# Patient Record
Sex: Male | Born: 1990 | Race: Black or African American | Hispanic: No | Marital: Single | Smoking: Current some day smoker
Health system: Southern US, Community
[De-identification: ages and names within clinical notes are randomized; demographics above are authoritative.]

---
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
## 2011-01-08 ENCOUNTER — Ambulatory Visit (INDEPENDENT_AMBULATORY_CARE_PROVIDER_SITE_OTHER): Payer: 59

## 2011-01-08 ENCOUNTER — Inpatient Hospital Stay (INDEPENDENT_AMBULATORY_CARE_PROVIDER_SITE_OTHER)
Admission: RE | Admit: 2011-01-08 | Discharge: 2011-01-08 | Disposition: A | Discharge status: Home / Self Care | Payer: 59 | Source: Ambulatory Visit | Attending: Family Medicine | Admitting: Family Medicine

## 2011-01-08 DIAGNOSIS — S63509A Unspecified sprain of unspecified wrist, initial encounter: Secondary | ICD-10-CM

## 2011-01-08 DIAGNOSIS — S93609A Unspecified sprain of unspecified foot, initial encounter: Secondary | ICD-10-CM

## 2011-01-24 ENCOUNTER — Inpatient Hospital Stay (INDEPENDENT_AMBULATORY_CARE_PROVIDER_SITE_OTHER)
Admission: RE | Admit: 2011-01-24 | Discharge: 2011-01-24 | Disposition: A | Discharge status: Home / Self Care | Payer: Managed Care, Other (non HMO) | Source: Ambulatory Visit | Attending: Family Medicine | Admitting: Family Medicine

## 2011-01-24 DIAGNOSIS — R6889 Other general symptoms and signs: Secondary | ICD-10-CM

## 2014-09-30 ENCOUNTER — Emergency Department (HOSPITAL_COMMUNITY)
Admission: EM | Admit: 2014-09-30 | Discharge: 2014-09-30 | Disposition: A | Discharge status: Home / Self Care | Payer: Managed Care, Other (non HMO) | Attending: Emergency Medicine | Admitting: Emergency Medicine

## 2014-09-30 ENCOUNTER — Encounter (HOSPITAL_COMMUNITY): Payer: Self-pay | Admitting: Emergency Medicine

## 2014-09-30 DIAGNOSIS — Z720 Tobacco use: Secondary | ICD-10-CM | POA: Insufficient documentation

## 2014-09-30 DIAGNOSIS — K0889 Other specified disorders of teeth and supporting structures: Secondary | ICD-10-CM

## 2014-09-30 DIAGNOSIS — K088 Other specified disorders of teeth and supporting structures: Secondary | ICD-10-CM | POA: Insufficient documentation

## 2014-09-30 MED ORDER — PENICILLIN V POTASSIUM 250 MG PO TABS
500.0000 mg | ORAL_TABLET | Freq: Once | ORAL | Status: AC
  Administered 2014-09-30: 500 mg via ORAL
  Filled 2014-09-30: qty 2

## 2014-09-30 MED ORDER — PENICILLIN V POTASSIUM 500 MG PO TABS: 500.0000 mg | ORAL_TABLET | Freq: Four times a day (QID) | ORAL | Status: AC

## 2014-09-30 MED ORDER — HYDROCODONE-ACETAMINOPHEN 5-325 MG PO TABS: 1.0000 | ORAL_TABLET | ORAL | Status: DC | PRN

## 2014-09-30 MED ORDER — HYDROCODONE-ACETAMINOPHEN 5-325 MG PO TABS
2.0000 | ORAL_TABLET | Freq: Once | ORAL | Status: AC
  Administered 2014-09-30: 2 via ORAL
  Filled 2014-09-30: qty 2

## 2014-09-30 NOTE — ED Notes (Signed)
C/O left lower dental pain and swelling x 2 days.

## 2014-09-30 NOTE — ED Provider Notes (Signed)
CSN: [id number]     Arrival date & time 09/30/14  1314 History  This chart was scribed for non-physician practitioner, Emilia BeckKaitlyn Dema Timmons, PA-C, working with Raeford RazorStephen Kohut, MD, by Ronney LionSuzanne Le, ED Scribe. This patient was seen in room TR07C/TR07C and the patient's care was started at 2:23 PM.     Chief Complaint  Patient presents with  . Dental Pain   HPI   HPI Comments: Herbert Cohen is a 24 y.o. male who presents to the Emergency Department complaining of dental pain that began one month ago and gingival swelling that began yesterday. Patient has tried one pill of his friend's prescription pain medication with relief. He denies fever. The pain is aching and severe without radiation. No aggravating/alleviating factors.    History reviewed. No pertinent past medical history. History reviewed. No pertinent past surgical history. No family history on file. History  Substance Use Topics  . Smoking status: Current Some Day Smoker  . Smokeless tobacco: Not on file  . Alcohol Use: Yes    Review of Systems  HENT: Positive for dental problem.   All other systems reviewed and are negative.     Allergies  Review of patient's allergies indicates no known allergies.  Home Medications   Prior to Admission medications   Not on File   BP 127/67 mmHg  Pulse 67  Temp(Src) 98.1 F (36.7 C) (Oral)  Ht 6\' 3"  (1.905 m)  Wt 180 lb (81.647 kg)  BMI 22.50 kg/m2  SpO2 100% Physical Exam  Constitutional: He is oriented to person, place, and time. He appears well-developed and well-nourished. No distress.  HENT:  Head: Normocephalic and atraumatic.  Good dentition. Gingival swelling, erythema, and tenderness to palpation in the left lower posterior area. No abscess noted.  Eyes: Conjunctivae and EOM are normal.  Neck: Neck supple. No tracheal deviation present.  Cardiovascular: Normal rate.   Pulmonary/Chest: Effort normal. No respiratory distress.  Musculoskeletal: Normal range of motion.   Neurological: He is alert and oriented to person, place, and time.  Skin: Skin is warm and dry.  Psychiatric: He has a normal mood and affect. His behavior is normal.  Nursing note and vitals reviewed.   ED Course  Procedures (including critical care time)  DIAGNOSTIC STUDIES: Oxygen Saturation is 100% on room air, normal by my interpretation.    COORDINATION OF CARE: 2:27 PM - Discussed treatment plan with pt at bedside which includes antibiotics, pain medication, and referral to on-call dentist and pt agreed to plan. Instructions to return if patient notices a fever.    MDM   Final diagnoses:  Pain, dental   Patient has no signs of ludwigs angina or drainable dental abscess. Vitals stable and patient afebrile. Patient will have Veetid and Vicodin for symptoms. Patient instructed to return with worsening or concerning symptoms.   I personally performed the services described in this documentation, which was scribed in my presence. The recorded information has been reviewed and is accurate.     [address]Jaziyah Gradel BanksSzekalski, PA-C 10/03/14 [phone]  Raeford RazorStephen Kohut, MD 10/03/14 [phone]

## 2014-09-30 NOTE — Discharge Instructions (Signed)
Take Veetid as directed until gone. Take Vicodin as needed for pain. Follow up with Dr. Russella DarBenitez for further evaluation. Return to the ED with worsening or concerning symptoms.

## 2016-08-23 ENCOUNTER — Encounter (HOSPITAL_COMMUNITY): Payer: Self-pay | Admitting: Emergency Medicine

## 2016-08-23 ENCOUNTER — Emergency Department (HOSPITAL_COMMUNITY)
Admission: EM | Admit: 2016-08-23 | Discharge: 2016-08-23 | Disposition: A | Discharge status: Home / Self Care | Payer: Managed Care, Other (non HMO) | Attending: Emergency Medicine | Admitting: Emergency Medicine

## 2016-08-23 DIAGNOSIS — H00015 Hordeolum externum left lower eyelid: Secondary | ICD-10-CM | POA: Insufficient documentation

## 2016-08-23 DIAGNOSIS — F17200 Nicotine dependence, unspecified, uncomplicated: Secondary | ICD-10-CM | POA: Insufficient documentation

## 2016-08-23 DIAGNOSIS — H00012 Hordeolum externum right lower eyelid: Secondary | ICD-10-CM

## 2016-08-23 MED ORDER — POLYMYXIN B-TRIMETHOPRIM 10000-0.1 UNIT/ML-% OP SOLN
1.0000 [drp] | Freq: Once | OPHTHALMIC | Status: AC
  Administered 2016-08-23: 1 [drp] via OPHTHALMIC
  Filled 2016-08-23: qty 10

## 2016-08-23 MED ORDER — POLYMYXIN B-TRIMETHOPRIM 10000-0.1 UNIT/ML-% OP SOLN: 1.0000 [drp] | OPHTHALMIC | 0 refills | Status: AC

## 2016-08-23 NOTE — Discharge Instructions (Signed)
Please follow with your primary care doctor in the next 2 days for a check-up. They must obtain records for further management.  ° °Do not hesitate to return to the Emergency Department for any new, worsening or concerning symptoms.  ° °

## 2016-08-23 NOTE — ED Provider Notes (Signed)
[facility]-EMERGENCY DEPT Provider Note   CSN: [id number] Arrival date & time: 08/23/16  2104  By signing my name below, I, Rosario AdieWilliam Andrew Hiatt, attest that this documentation has been prepared under the direction and in the presence of [employer] Corporation[employer]ole Onnie Hatchel, PA-C.  Electronically Signed: Rosario AdieWilliam Andrew Hiatt, ED Scribe. 08/23/16. 10:07 PM.  History   Chief Complaint Chief Complaint  Patient presents with  . Stye   The history is provided by the patient. No language interpreter was used.    HPI Comments: Herbert Cohen is a 25 y.o. male with no other PMHx, who presents to the Emergency Department complaining of gradually worsening, small area of pain and swelling to the left, lower eyelid onset approximately 3 weeks ago. Pt reports associated left eye redness and occasional clear drainage from the eye since the onset of this issue. He has been applying warm compresses to the area with minimal relief. His pain to the area is exacerbated with blinking. He denies fever, blurry vision, or any other associated symptoms.  History reviewed. No pertinent past medical history.  There are no active problems to display for this patient.  History reviewed. No pertinent surgical history.  Home Medications    Prior to Admission medications   Medication Sig Start Date End Date Taking? Authorizing Provider  HYDROcodone-acetaminophen (NORCO/VICODIN) 5-325 MG per tablet Take 1-2 tablets by mouth every 4 (four) hours as needed for moderate pain or severe pain. 09/30/14   Kaitlyn Szekalski, PA-C  penicillin v potassium (VEETID) 500 MG tablet Take 1 tablet (500 mg total) by mouth 4 (four) times daily. 09/30/14   Emilia BeckKaitlyn Szekalski, PA-C   Family History No family history on file.  Social History Social History  Substance Use Topics  . Smoking status: Current Some Day Smoker  . Smokeless tobacco: Never Used  . Alcohol use Yes   Allergies   Patient has no known allergies.  Review of Systems Review of  Systems  A complete 10 system review of systems was obtained and all systems are negative except as noted in the HPI and PMH.   Physical Exam Updated Vital Signs BP 154/82 (BP Location: Left Arm)   Pulse 74   Temp 98.3 F (36.8 C) (Oral)   Resp 18   Ht 6\' 3"  (1.905 m)   Wt 185 lb (83.9 kg)   SpO2 100%   BMI 23.12 kg/m   Physical Exam  Constitutional: He appears well-developed and well-nourished. No distress.  HENT:  Head: Normocephalic and atraumatic.  Left lower eyelid with stye to the medial aspects. Trace conjunctival injection and no discharge pupils equal round reactive to light, extraocular movement is intact without pain or diplopia.  Eyes: Conjunctivae are normal.  Neck: Normal range of motion.  Cardiovascular: Normal rate.   Pulmonary/Chest: Effort normal.  Abdominal: He exhibits no distension.  Musculoskeletal: Normal range of motion.  Neurological: He is alert.  Skin: No pallor.  Psychiatric: He has a normal mood and affect. His behavior is normal.  Nursing note and vitals reviewed.  ED Treatments / Results  DIAGNOSTIC STUDIES: Oxygen Saturation is 100% on RA, normal by my interpretation.   COORDINATION OF CARE: 10:07 PM-Discussed next steps with pt. Pt verbalized understanding and is agreeable with the plan.   Labs (all labs ordered are listed, but only abnormal results are displayed) Labs Reviewed - No data to display  EKG  EKG Interpretation None      Radiology No results found.  Procedures Procedures  Medications Ordered in ED  Medications - No data to display  Initial Impression / Assessment and Plan / ED Course  I have reviewed the triage vital signs and the nursing notes.  Pertinent labs & imaging results that were available during my care of the patient were reviewed by me and considered in my medical decision making (see chart for details).  Clinical Course    Vitals:   08/23/16 2109  BP: 154/82  Pulse: 74  Resp: 18  Temp:  98.3 F (36.8 C)  TempSrc: Oral  SpO2: 100%  Weight: 83.9 kg  Height: 6\' 3"  (1.905 m)    Medications  trimethoprim-polymyxin b (POLYTRIM) ophthalmic solution 1 drop (not administered)    Herbert Cohen is 25 y.o. male presenting with Persistent stye to left eye, patient will be started on Polytrim, he's artery performing warm compresses. Will be given referral to ophthalmology for possible procedure.   Evaluation does not show pathology that would require ongoing emergent intervention or inpatient treatment. Pt is hemodynamically stable and mentating appropriately. Discussed findings and plan with patient/guardian, who agrees with care plan. All questions answered. Return precautions discussed and outpatient follow up given.      Final Clinical Impressions(s) / ED Diagnoses   Final diagnoses:  None   New Prescriptions New Prescriptions   No medications on file   I personally performed the services described in this documentation, which was scribed in my presence. The recorded information has been reviewed and is accurate.     Wynetta Emeryicole Avereigh Spainhower, PA-C 08/23/16 2224    Rolland PorterMark James, MD 09/06/16 0030

## 2016-08-23 NOTE — ED Notes (Signed)
Patient Alert and oriented X4. Stable and ambulatory. Patient verbalized understanding of the discharge instructions.  Patient belongings were taken by the patient.  

## 2016-08-23 NOTE — ED Triage Notes (Signed)
Patient reports stye at left eye onset 3 weeks ago with redness/occasional drainage , denies blurred vision .

## 2021-02-16 ENCOUNTER — Emergency Department (HOSPITAL_COMMUNITY)
Admission: EM | Admit: 2021-02-16 | Discharge: 2021-02-17 | Disposition: A | Discharge status: Home / Self Care | Payer: Self-pay | Attending: Emergency Medicine | Admitting: Emergency Medicine

## 2021-02-16 ENCOUNTER — Emergency Department (HOSPITAL_COMMUNITY): Payer: Self-pay

## 2021-02-16 ENCOUNTER — Other Ambulatory Visit: Payer: Self-pay

## 2021-02-16 ENCOUNTER — Encounter (HOSPITAL_COMMUNITY): Payer: Self-pay

## 2021-02-16 DIAGNOSIS — N50812 Left testicular pain: Secondary | ICD-10-CM | POA: Insufficient documentation

## 2021-02-16 DIAGNOSIS — F17200 Nicotine dependence, unspecified, uncomplicated: Secondary | ICD-10-CM | POA: Insufficient documentation

## 2021-02-16 DIAGNOSIS — N451 Epididymitis: Secondary | ICD-10-CM | POA: Insufficient documentation

## 2021-02-16 LAB — URINALYSIS, COMPLETE (UACMP) WITH MICROSCOPIC
Bilirubin Urine: NEGATIVE
Glucose, UA: NEGATIVE mg/dL
Ketones, ur: NEGATIVE mg/dL
Nitrite: NEGATIVE
Protein, ur: NEGATIVE mg/dL
RBC / HPF: >50 RBC/hpf — ABNORMAL HIGH (ref 0–5)
Specific Gravity, Urine: 1.026 (ref 1.005–1.030)
pH: 6 (ref 5.0–8.0)

## 2021-02-16 NOTE — ED Notes (Signed)
Ultrasound at bedside

## 2021-02-16 NOTE — ED Triage Notes (Signed)
Pt c/o testicular pain x3 days. Pt c/o discharge coming from penis x3 days. Pt had unprotected sex 1 month ago.

## 2021-02-16 NOTE — ED Provider Notes (Signed)
Emergency Medicine Provider Triage Evaluation Note  Herbert Cohen , a 30 y.o. male  was evaluated in triage.  Pt complains of discharge for about a week.  He states that three days ago he started to have pain and swelling in the left testicle.    No fevers, pain and swelling in left testicle only.  Pain is constant for three days.    No rectal pain, no pain with BM.  He does report that he had a small ulcer that was non painful on his penis when this all started.   Review of Systems  Positive: *Left testicle pain and swelling, penile discharge Negative: Fever  Physical Exam  BP (!) 137/94 (BP Location: Left Arm)   Pulse 83   Temp 98.4 F (36.9 C) (Oral)   Resp 18   Ht 6\' 3"  (1.905 m)   Wt 77.1 kg   SpO2 100%   BMI 21.25 kg/m  Gen:   Awake, no distress   Resp:  Normal effort  MSK:   Moves extremities without difficulty  Other:  GU exam deferred.   Medical Decision Making  Medically screening exam initiated at 10:52 PM.  Appropriate orders placed.  Herbert Cohen was informed that the remainder of the evaluation will be completed by another provider, this initial triage assessment does not replace that evaluation, and the importance of remaining in the ED until their evaluation is complete.  Orders placed for UA, GC, HIV and RPR given he reports a painless ulcer.      Darral Dash 02/16/21 2257    02/18/21, MD 02/16/21 2314

## 2021-02-16 NOTE — ED Notes (Signed)
Patient in room, complains of pain 7/10 in left teste.

## 2021-02-16 NOTE — ED Provider Notes (Signed)
[facility]-EMERGENCY DEPT Provider Note   CSN: [id number] Arrival date & time: 02/16/21  2239     History Chief Complaint  Patient presents with  . Groin Pain    Herbert Cohen is a 30 y.o. male.  The history is provided by the patient and medical records.  Groin Pain    29 y.o. M presenting to the ED with groin pain.  States 3 days ago he started noticing some pain/tenderness in his left testicle.  This has become increasingly more pain and now feels like testicle is swollen.  He does report some penile discharge.  He had sexual encounter with ex girlfriend recently, unsure of her STD status or if she is having symptoms.  He did notice a painless "bump" on his penis about a week ago, that has since gone away.  He denies history of herpes or similar.  No fevers, rashes.  History reviewed. No pertinent past medical history.  There are no problems to display for this patient.   History reviewed. No pertinent surgical history.     History reviewed. No pertinent family history.  Social History   Tobacco Use  . Smoking status: Current Some Day Smoker  . Smokeless tobacco: Never Used  Substance Use Topics  . Alcohol use: Yes  . Drug use: No    Home Medications Prior to Admission medications   Medication Sig Start Date End Date Taking? Authorizing Provider  HYDROcodone-acetaminophen (NORCO/VICODIN) 5-325 MG per tablet Take 1-2 tablets by mouth every 4 (four) hours as needed for moderate pain or severe pain. 09/30/14   Emilia Beck, PA-C  penicillin v potassium (VEETID) 500 MG tablet Take 1 tablet (500 mg total) by mouth 4 (four) times daily. 09/30/14   Emilia Beck, PA-C  trimethoprim-polymyxin b (POLYTRIM) ophthalmic solution Place 1 drop into the right eye every 4 (four) hours. 08/23/16   Pisciotta, Joni Reining, PA-C    Allergies    Patient has no known allergies.  Review of Systems   Review of Systems  Genitourinary: Positive for testicular  pain.  All other systems reviewed and are negative.   Physical Exam Updated Vital Signs BP (!) 137/94 (BP Location: Left Arm)   Pulse 83   Temp 98.4 F (36.9 C) (Oral)   Resp 18   Ht 6\' 3"  (1.905 m)   Wt 77.1 kg   SpO2 100%   BMI 21.25 kg/m   Physical Exam Vitals and nursing note reviewed.  Constitutional:      Appearance: He is well-developed.  HENT:     Head: Normocephalic and atraumatic.  Eyes:     Conjunctiva/sclera: Conjunctivae normal.     Pupils: Pupils are equal, round, and reactive to light.  Cardiovascular:     Rate and Rhythm: Normal rate and regular rhythm.     Heart sounds: Normal heart sounds.  Pulmonary:     Effort: Pulmonary effort is normal.     Breath sounds: Normal breath sounds.  Abdominal:     General: Bowel sounds are normal.     Palpations: Abdomen is soft.  Genitourinary:    Comments: Exam chaperoned by RN Normal male genitalia, penis circumcised without visible rash/lesion, no noted discharge, left testicle is swollen and firm compared with right, normal lie Musculoskeletal:        General: Normal range of motion.     Cervical back: Normal range of motion.  Skin:    General: Skin is warm and dry.  Neurological:     Mental  Status: He is alert and oriented to person, place, and time.     ED Results / Procedures / Treatments   Labs (all labs ordered are listed, but only abnormal results are displayed) Labs Reviewed  URINALYSIS, COMPLETE (UACMP) WITH MICROSCOPIC - Abnormal; Notable for the following components:      Result Value   APPearance CLOUDY (*)    Hgb urine dipstick SMALL (*)    Leukocytes,Ua LARGE (*)    RBC / HPF >50 (*)    Bacteria, UA RARE (*)    All other components within normal limits  RPR  HIV ANTIBODY (ROUTINE TESTING W REFLEX)  GC/CHLAMYDIA PROBE AMP ([place]) NOT AT [facility]    EKG None  Radiology US SCROTUM W/DOPPLER  Result Date: 02/17/2021 CLINICAL DATA:  Left testicular pain X 3 days EXAM: SCROTAL  ULTRASOUND DOPPLER ULTRASOUND OF THE TESTICLES TECHNIQUE: Complete ultrasound examination of the testicles, epididymis, and other scrotal structures was performed. Color and spectral Doppler ultrasound were also utilized to evaluate blood flow to the testicles. COMPARISON:  None. FINDINGS: Right testicle Measurements: 4.4 x 2.4 x 2.9 cm. No mass or microlithiasis visualized. Left testicle Measurements: 3.7 x 2 x 2.8 cm. No mass or microlithiasis visualized. Right epididymis: Epididymal cyst measuring up to 0.6 cm. Otherwise normal in size and appearance. Left epididymis: Enlarged and heterogeneous with increased vascularity. Hydrocele:  None visualized. Varicocele:  None visualized. Pulsed Doppler interrogation of both testes demonstrates normal low resistance arterial and venous waveforms bilaterally. IMPRESSION: Left epididymitis.  No abscess formation. These results will be called to the ordering clinician or representative by the Radiologist Assistant, and communication documented in the PACS or [employer]. Electronically Signed   By: Tish Frederickson M.D.   On: 02/17/2021 00:22    Procedures Procedures   Medications Ordered in ED Medications - No data to display  ED Course  I have reviewed the triage vital signs and the nursing notes.  Pertinent labs & imaging results that were available during my care of the patient were reviewed by me and considered in my medical decision making (see chart for details).    MDM Rules/Calculators/A&P  30 year old male presenting to the ED with left testicle pain.  Also reports some penile discharge and pain "bump" to his penis a few days ago but that is since resolved.  Does report sexual encounter with his ex-girlfriend recently and he is unsure of her STD status.  On exam, left testicle does appear swollen and firm compared with right but there is a normal lie.  There is no appreciable penile lesion or discharge.  HIV, RPR, gc/chl testing has been sent.   Ultrasound of the scrotum with findings of left epididymitis.  There is no associated abscess formation and he has good blood flow to both testes.  Suspect this may ultimately be STD related.  He is treated empirically here for gonorrhea and chlamydia and will discharge home on course of doxycycline.  Given urology follow-up.  May return here for new concerns.  Final Clinical Impression(s) / ED Diagnoses Final diagnoses:  Pain in left testicle  Epididymitis    Rx / DC Orders ED Discharge Orders         Ordered    doxycycline (VIBRAMYCIN) 100 MG capsule  2 times daily        02/17/21 0127           Garlon Hatchet, PA-C 02/17/21 0139    Dione Booze, MD 02/17/21 [phone]

## 2021-02-17 LAB — RPR: RPR Ser Ql: NONREACTIVE

## 2021-02-17 LAB — HIV ANTIBODY (ROUTINE TESTING W REFLEX): HIV Screen 4th Generation wRfx: NONREACTIVE

## 2021-02-17 MED ORDER — STERILE WATER FOR INJECTION IJ SOLN: 2.0000 mL | Freq: Once | INTRAMUSCULAR | Status: AC

## 2021-02-17 MED ORDER — DOXYCYCLINE HYCLATE 100 MG PO TABS
100.0000 mg | ORAL_TABLET | Freq: Once | ORAL | Status: AC
  Administered 2021-02-17: 100 mg via ORAL
  Filled 2021-02-17: qty 1

## 2021-02-17 MED ORDER — CEFTRIAXONE SODIUM 250 MG IJ SOLR
250.0000 mg | Freq: Once | INTRAMUSCULAR | Status: AC
  Administered 2021-02-17: 250 mg via INTRAMUSCULAR
  Filled 2021-02-17: qty 250

## 2021-02-17 MED ORDER — DOXYCYCLINE HYCLATE 100 MG PO CAPS: 100.0000 mg | ORAL_CAPSULE | Freq: Two times a day (BID) | ORAL | 0 refills | Status: DC

## 2021-02-17 MED ORDER — AZITHROMYCIN 250 MG PO TABS
1000.0000 mg | ORAL_TABLET | Freq: Once | ORAL | Status: AC
  Administered 2021-02-17: 1000 mg via ORAL
  Filled 2021-02-17: qty 4

## 2021-02-17 MED ORDER — STERILE WATER FOR INJECTION IJ SOLN
INTRAMUSCULAR | Status: AC
  Administered 2021-02-17: 2 mL via INTRAMUSCULAR
  Filled 2021-02-17: qty 10

## 2021-02-17 NOTE — Discharge Instructions (Addendum)
Take the prescribed medication as directed.  Make sure to finish ALL the medication. You will be contacted if your STD culture are positive. Follow-up with urology if any ongoing issues or if symptoms not improving. Return to the ED for new or worsening symptoms-- high fever, difficulty urinating, etc.

## 2021-10-22 ENCOUNTER — Other Ambulatory Visit: Payer: Self-pay

## 2021-10-22 ENCOUNTER — Emergency Department (HOSPITAL_COMMUNITY)
Admission: EM | Admit: 2021-10-22 | Discharge: 2021-10-22 | Disposition: A | Discharge status: Home / Self Care | Payer: Medicaid Other | Attending: Emergency Medicine | Admitting: Emergency Medicine

## 2021-10-22 ENCOUNTER — Encounter (HOSPITAL_COMMUNITY): Payer: Self-pay | Admitting: Emergency Medicine

## 2021-10-22 DIAGNOSIS — H00014 Hordeolum externum left upper eyelid: Secondary | ICD-10-CM | POA: Insufficient documentation

## 2021-10-22 MED ORDER — CEPHALEXIN 500 MG PO CAPS
500.0000 mg | ORAL_CAPSULE | Freq: Once | ORAL | Status: AC
Start: 2021-10-22 — End: 2021-10-22
  Administered 2021-10-22: 500 mg via ORAL
  Filled 2021-10-22: qty 1

## 2021-10-22 MED ORDER — CEPHALEXIN 500 MG PO CAPS: 500.0000 mg | ORAL_CAPSULE | Freq: Two times a day (BID) | ORAL | 0 refills | Status: AC

## 2021-10-22 NOTE — ED Provider Triage Note (Signed)
Emergency Medicine Provider Triage Evaluation Note  Herbert Cohen , a 31 y.o. male  was evaluated in triage.  Pt complains of left eye pain and blurriness. States that he has had a stye for a few weeks now however over the past week has had worsening blurry vision in the affected left eye.  He denies any fevers and otherwise feels well.   Physical Exam  BP 132/81 (BP Location: Right Arm)    Pulse (!) 58    Temp 98.1 F (36.7 C) (Oral)    Resp 16    SpO2 99%  Gen:   Awake, no distress   Resp:  Normal effort  MSK:   Moves extremities without difficulty  Other:  Swelling of the left eye lid.   Medical Decision Making  Medically screening exam initiated at 10:45 PM.  Appropriate orders placed.  Herbert Cohen was informed that the remainder of the evaluation will be completed by another provider, this initial triage assessment does not replace that evaluation, and the importance of remaining in the ED until their evaluation is complete.  Note: Portions of this report may have been transcribed using voice recognition software. Every effort was made to ensure accuracy; however, inadvertent computerized transcription errors may be present    Lorin Glass, PA-C 10/22/21 2246
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
## 2021-10-22 NOTE — ED Provider Notes (Signed)
[facility]-EMERGENCY DEPT Provider Note   CSN: [id number] Arrival date & time: 10/22/21  2153     History  Chief Complaint  Patient presents with   Eye Pain    Herbert Cohen is a 31 y.o. male presenting to the ED with eyelid swelling.  Hx of styes, has had this before several years ago.  This is ongoing for 2 weeks, tried warm compresses at home, no change.  He does not wear contacts or glasses, vision is 20/20.  HPI     Home Medications Prior to Admission medications   Medication Sig Start Date End Date Taking? Authorizing Provider  cephALEXin (KEFLEX) 500 MG capsule Take 1 capsule (500 mg total) by mouth 2 (two) times daily for 5 days. 10/23/21 10/28/21 Yes Alyannah Sanks, Kermit Balo, MD  doxycycline (VIBRAMYCIN) 100 MG capsule Take 1 capsule (100 mg total) by mouth 2 (two) times daily. 02/17/21   Garlon Hatchet, PA-C  HYDROcodone-acetaminophen (NORCO/VICODIN) 5-325 MG per tablet Take 1-2 tablets by mouth every 4 (four) hours as needed for moderate pain or severe pain. 09/30/14   Emilia Beck, PA-C  penicillin v potassium (VEETID) 500 MG tablet Take 1 tablet (500 mg total) by mouth 4 (four) times daily. 09/30/14   Emilia Beck, PA-C  trimethoprim-polymyxin b (POLYTRIM) ophthalmic solution Place 1 drop into the right eye every 4 (four) hours. 08/23/16   Pisciotta, Joni Reining, PA-C      Allergies    Patient has no known allergies.    Review of Systems   Review of Systems  Physical Exam Updated Vital Signs BP 132/81 (BP Location: Right Arm)    Pulse (!) 58    Temp 98.1 F (36.7 C) (Oral)    Resp 16    SpO2 99%  Physical Exam Constitutional:      General: He is not in acute distress. HENT:     Head: Normocephalic and atraumatic.  Eyes:     General: No scleral icterus.       Right eye: No discharge.        Left eye: No discharge.     Conjunctiva/sclera: Conjunctivae normal.     Pupils: Pupils are equal, round, and reactive to light.     Comments: Stye  left upper eyelid, no drainage  Skin:    General: Skin is warm and dry.  Neurological:     Mental Status: He is alert.    ED Results / Procedures / Treatments   Labs (all labs ordered are listed, but only abnormal results are displayed) Labs Reviewed - No data to display  EKG None  Radiology No results found.  Procedures Procedures    Medications Ordered in ED Medications  cephALEXin (KEFLEX) capsule 500 mg (has no administration in time range)    ED Course/ Medical Decision Making/ A&P                           Medical Decision Making Risk Prescription drug management.   Patient is here with left eye stye No evidence of conjunctivitis, posterior orbital infection,  Reasonable to try keflex x 7 days as warm compresses haven't worked alone; would direct ophthalmologist next week if not improved (info provided to patient).        Final Clinical Impression(s) / ED Diagnoses Final diagnoses:  Hordeolum externum of left upper eyelid    Rx / DC Orders ED Discharge Orders  Ordered    cephALEXin (KEFLEX) 500 MG capsule  2 times daily        10/22/21 2330              Terald Sleeper, MD 10/22/21 2330

## 2022-01-12 IMAGING — US US SCROTUM W/ DOPPLER COMPLETE
1 series · 14 of 25 positions shown · non-contrast
Comparison: None.

CLINICAL DATA: Left testicular pain X 3 days

EXAM:
SCROTAL ULTRASOUND
DOPPLER ULTRASOUND OF THE TESTICLES
TECHNIQUE: Complete ultrasound examination of the testicles, epididymis, and
other scrotal structures was performed. Color and spectral Doppler
ultrasound were also utilized to evaluate blood flow to the
testicles.

[Series 1: us scrotum w/ doppler complete · 14 of 66 slices shown]
[im 1/66]
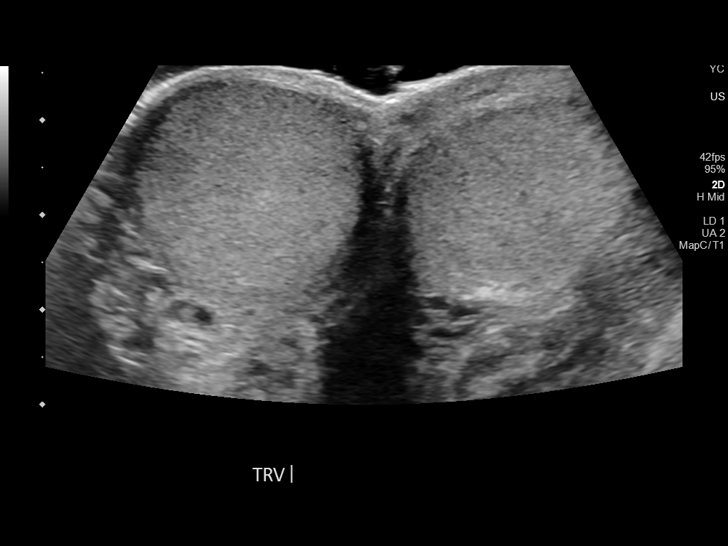
[im 6/66]
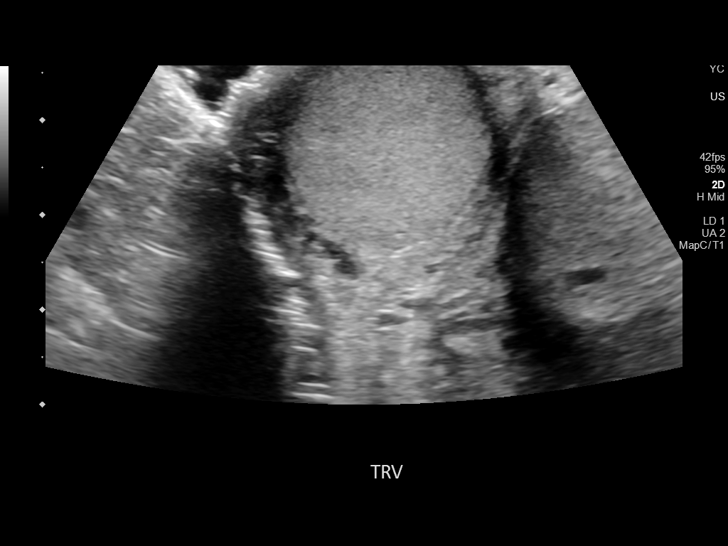
[im 11/66]
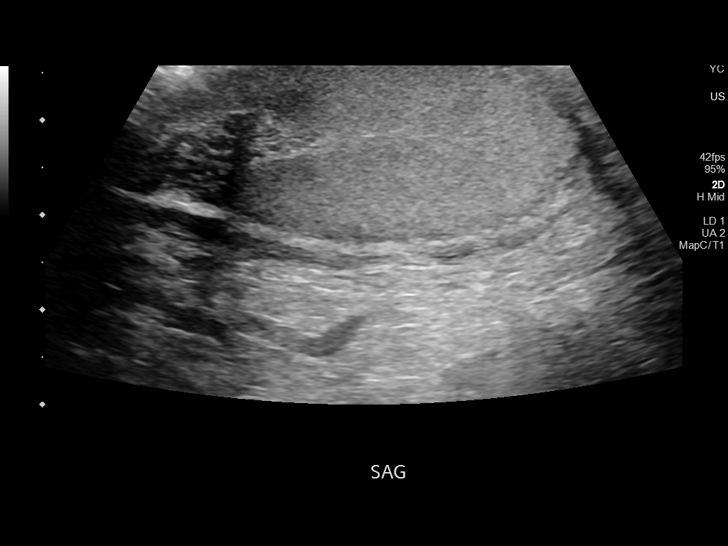
[im 17/66]
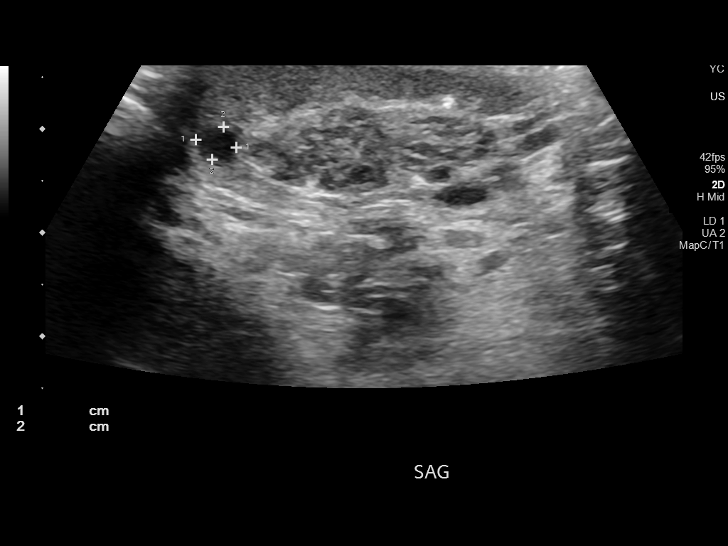
[im 22/66]
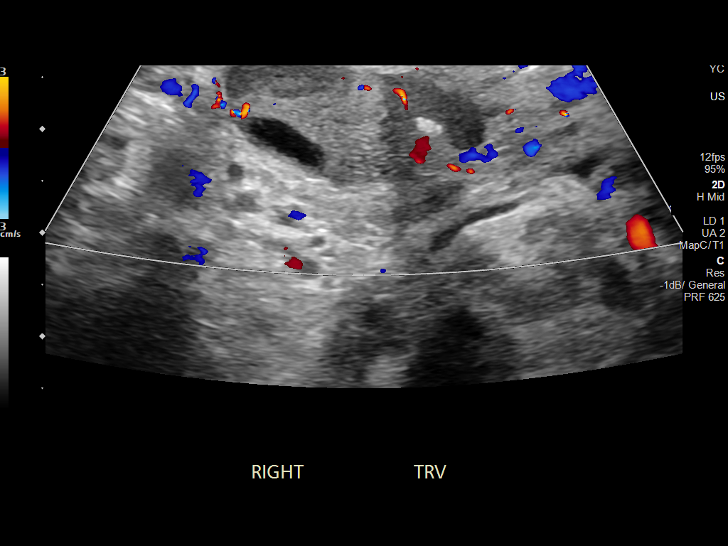
[im 25/66]
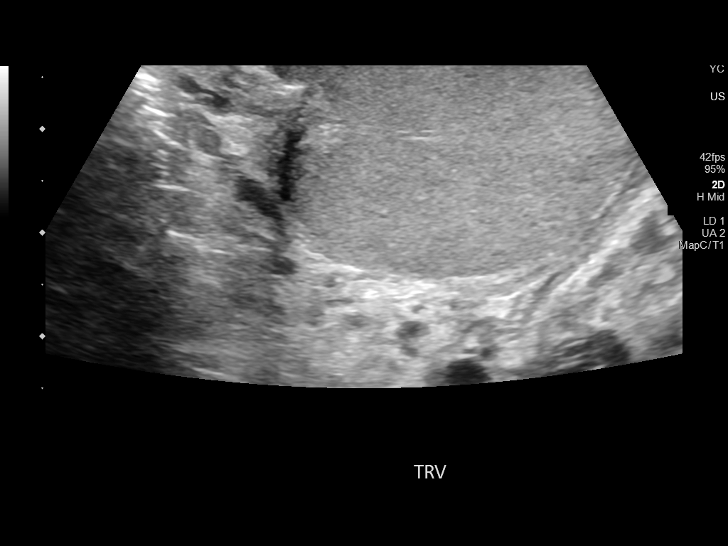
[im 30/66]
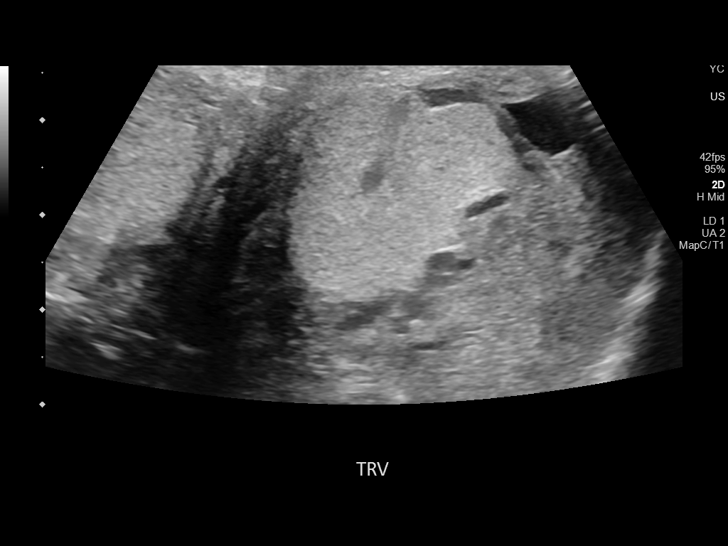
[im 36/66]
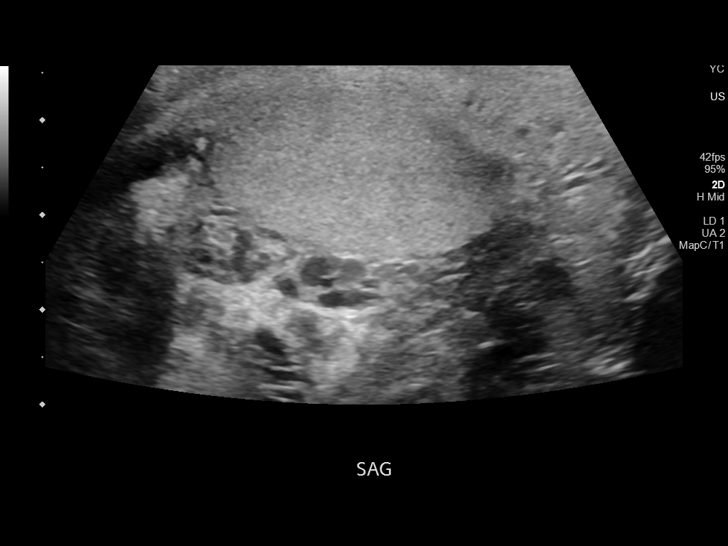
[im 41/66]
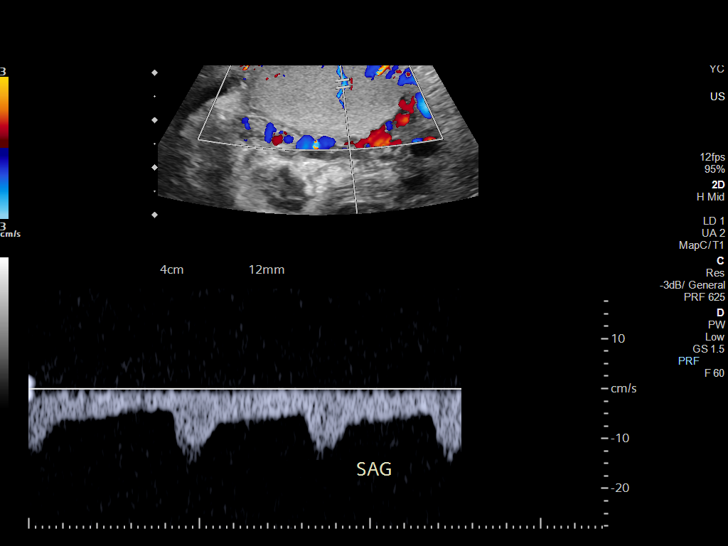
[im 44/66]
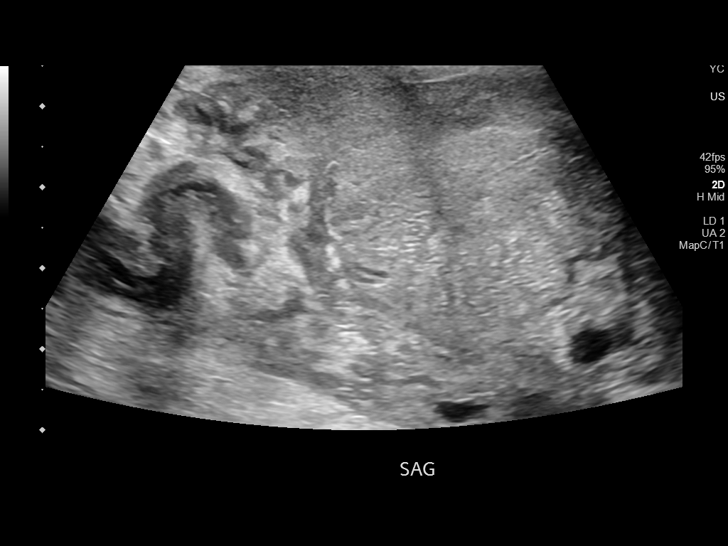
[im 49/66]
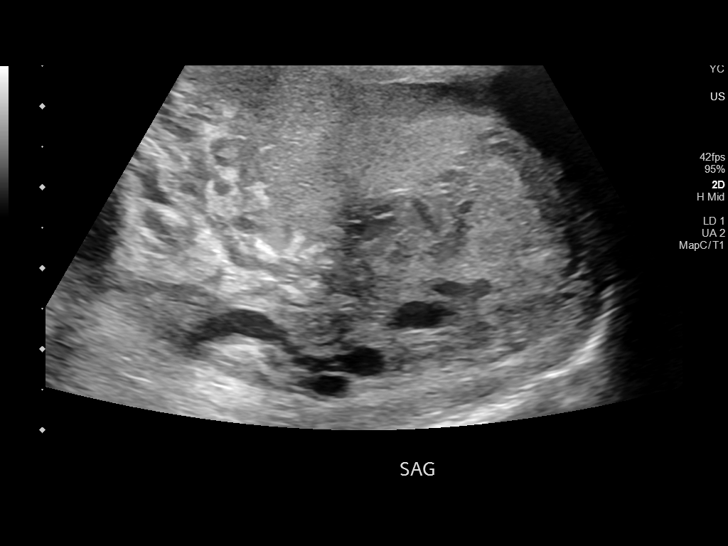
[im 55/66]
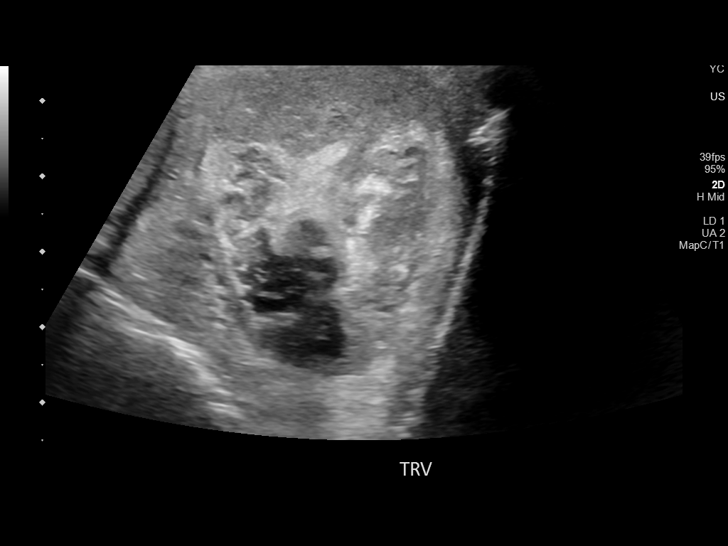
[im 60/66]
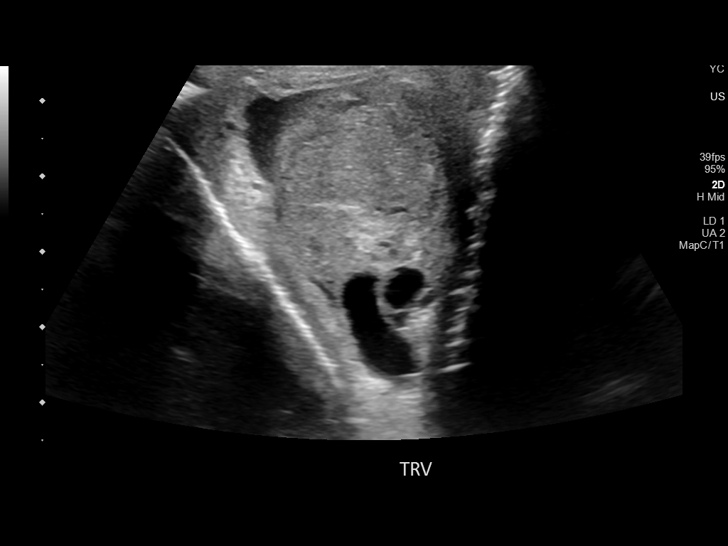
[im 66/66]
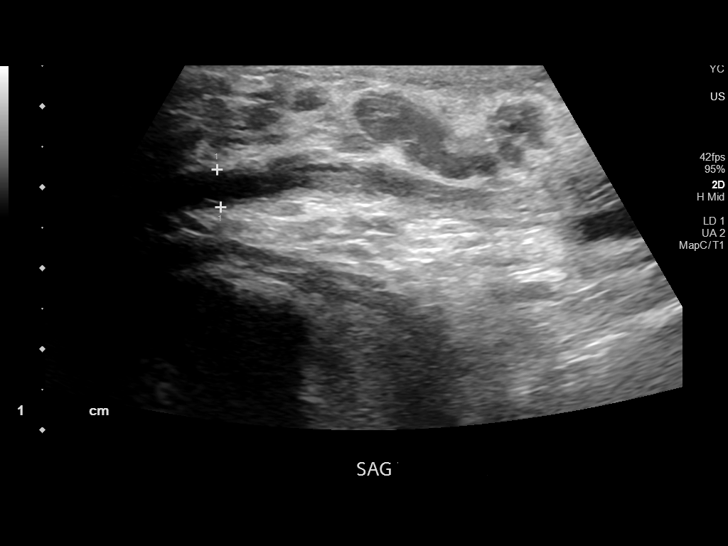

[14 of 25 positions shown; findings below may reference images not displayed]

FINDINGS: Right testicle

Measurements: 4.4 x 2.4 x 2.9 cm. No mass or microlithiasis
visualized.

Left testicle

Measurements: 3.7 x 2 x 2.8 cm. No mass or microlithiasis
visualized.

Right epididymis: Epididymal cyst measuring up to 0.6 cm. Otherwise
normal in size and appearance.

Left epididymis: Enlarged and heterogeneous with increased
vascularity.

Hydrocele:  None visualized.

Varicocele:  None visualized.

Pulsed Doppler interrogation of both testes demonstrates normal low
resistance arterial and venous waveforms bilaterally.
IMPRESSION: Left epididymitis.  No abscess formation.

These results will be called to the ordering clinician or
representative by the Radiologist Assistant, and communication
documented in the PACS or [REDACTED].

## 2022-02-11 ENCOUNTER — Ambulatory Visit: Payer: Medicaid Other | Attending: Obstetrics and Gynecology

## 2022-03-01 ENCOUNTER — Telehealth: Payer: Self-pay | Admitting: Genetics

## 2022-03-01 NOTE — Telephone Encounter (Signed)
Called Jong regarding his [employer] carrier screening results. His [employer] saliva sample could not be processed and a repeat sample is needed in order to complete the screening. Zeven declined a repeat saliva sample and would like to come in for a blood draw. An appointment for him will be set up for Friday June 9th around 9:30am to complete this blood draw.

## 2022-03-04 ENCOUNTER — Ambulatory Visit: Payer: Medicaid Other | Attending: Medical

## 2022-03-07 ENCOUNTER — Telehealth: Payer: Self-pay | Admitting: Genetics

## 2022-03-07 NOTE — Telephone Encounter (Signed)
Herbert Cohen was scheduled for a blood draw appointment in the [facility] on 6/9 for repeat carrier screening. Herbert Cohen did not keep this appointment. Genetic counseling called Herbert Cohen to reschedule this appointment and left him a voicemail with the [facility] call back number.

## 2022-03-16 ENCOUNTER — Telehealth: Payer: Self-pay | Admitting: Genetics

## 2022-03-16 NOTE — Telephone Encounter (Signed)
Second attempt to contact Herbert Cohen regarding repeat carrier screening. Herbert Cohen was scheduled for a blood draw appointment in the [facility] on 6/9 for repeat carrier screening. Herbert Cohen did not keep this appointment. Genetic counseling called Herbert Cohen to reschedule this appointment and left him a voicemail with the [facility] call back number.

## 2022-03-18 ENCOUNTER — Telehealth: Payer: Self-pay | Admitting: Genetics

## 2022-03-18 NOTE — Telephone Encounter (Signed)
Third and final attempt to contact Herbert Cohen regarding repeat carrier screening. Herbert Cohen was scheduled for a blood draw appointment in the [facility] on 6/9 for repeat carrier screening. [facility] did not keep this appointment. Genetic counseling called Herbert Cohen to reschedule this appointment and left him a voicemail with the [facility] call back number.

## 2022-12-27 ENCOUNTER — Emergency Department (HOSPITAL_COMMUNITY): Payer: Medicaid Other

## 2022-12-27 ENCOUNTER — Other Ambulatory Visit: Payer: Self-pay

## 2022-12-27 ENCOUNTER — Emergency Department (HOSPITAL_COMMUNITY)
Admission: EM | Admit: 2022-12-27 | Discharge: 2022-12-28 | Disposition: A | Discharge status: Home / Self Care | Payer: Medicaid Other | Attending: Emergency Medicine | Admitting: Emergency Medicine

## 2022-12-27 ENCOUNTER — Encounter (HOSPITAL_COMMUNITY): Payer: Self-pay | Admitting: *Deleted

## 2022-12-27 DIAGNOSIS — R519 Headache, unspecified: Secondary | ICD-10-CM | POA: Diagnosis not present

## 2022-12-27 DIAGNOSIS — R21 Rash and other nonspecific skin eruption: Secondary | ICD-10-CM | POA: Insufficient documentation

## 2022-12-27 DIAGNOSIS — Z1152 Encounter for screening for COVID-19: Secondary | ICD-10-CM | POA: Insufficient documentation

## 2022-12-27 LAB — CBC WITH DIFFERENTIAL/PLATELET
Abs Immature Granulocytes: 0.01 10*3/uL (ref 0.00–0.07)
Basophils Absolute: 0 10*3/uL (ref 0.0–0.1)
Basophils Relative: 1 %
Eosinophils Absolute: 0 10*3/uL (ref 0.0–0.5)
Eosinophils Relative: 0 %
HCT: 43.4 % (ref 39.0–52.0)
Hemoglobin: 14.8 g/dL (ref 13.0–17.0)
Immature Granulocytes: 0 %
Lymphocytes Relative: 42 %
Lymphs Abs: 1.4 10*3/uL (ref 0.7–4.0)
MCH: 32.9 pg (ref 26.0–34.0)
MCHC: 34.1 g/dL (ref 30.0–36.0)
MCV: 96.4 fL (ref 80.0–100.0)
Monocytes Absolute: 0.3 10*3/uL (ref 0.1–1.0)
Monocytes Relative: 9 %
Neutro Abs: 1.5 10*3/uL — ABNORMAL LOW (ref 1.7–7.7)
Neutrophils Relative %: 48 %
Platelets: 133 10*3/uL — ABNORMAL LOW (ref 150–400)
RBC: 4.5 MIL/uL (ref 4.22–5.81)
RDW: 11.9 % (ref 11.5–15.5)
WBC: 3.3 10*3/uL — ABNORMAL LOW (ref 4.0–10.5)
nRBC: 0 % (ref 0.0–0.2)

## 2022-12-27 LAB — COMPREHENSIVE METABOLIC PANEL
ALT: 59 U/L — ABNORMAL HIGH (ref 0–44)
AST: 47 U/L — ABNORMAL HIGH (ref 15–41)
Albumin: 4.2 g/dL (ref 3.5–5.0)
Alkaline Phosphatase: 59 U/L (ref 38–126)
Anion gap: 11 (ref 5–15)
BUN: 13 mg/dL (ref 6–20)
CO2: 25 mmol/L (ref 22–32)
Calcium: 8.5 mg/dL — ABNORMAL LOW (ref 8.9–10.3)
Chloride: 96 mmol/L — ABNORMAL LOW (ref 98–111)
Creatinine, Ser: 0.97 mg/dL (ref 0.61–1.24)
GFR, Estimated: >60 mL/min (ref >60)
Glucose, Bld: 93 mg/dL (ref 70–99)
Potassium: 3.3 mmol/L — ABNORMAL LOW (ref 3.5–5.1)
Sodium: 132 mmol/L — ABNORMAL LOW (ref 135–145)
Total Bilirubin: 0.6 mg/dL (ref 0.3–1.2)
Total Protein: 8.1 g/dL (ref 6.5–8.1)

## 2022-12-27 LAB — LACTIC ACID, PLASMA: Lactic Acid, Venous: 0.7 mmol/L (ref 0.5–1.9)

## 2022-12-27 MED ORDER — PENICILLIN G BENZATHINE 1200000 UNIT/2ML IM SUSY
2.4000 10*6.[IU] | PREFILLED_SYRINGE | Freq: Once | INTRAMUSCULAR | Status: AC
  Administered 2022-12-27: 2.4 10*6.[IU] via INTRAMUSCULAR
  Filled 2022-12-27: qty 4

## 2022-12-27 MED ORDER — SODIUM CHLORIDE 0.9 % IV BOLUS
1000.0000 mL | Freq: Once | INTRAVENOUS | Status: AC
  Administered 2022-12-27: 1000 mL via INTRAVENOUS

## 2022-12-27 MED ORDER — ACETAMINOPHEN 325 MG PO TABS
650.0000 mg | ORAL_TABLET | Freq: Once | ORAL | Status: AC | PRN
  Administered 2022-12-27: 650 mg via ORAL
  Filled 2022-12-27: qty 2

## 2022-12-27 NOTE — ED Triage Notes (Signed)
Pt pulled an ingrown hair from groin a couple days ago, reports having swelling to area now. Also reports having headache for a few days, relieved with advil. Has been having fever and chills at home. Denies headache at present

## 2022-12-27 NOTE — ED Provider Notes (Signed)
[facility] Provider Note   CSN: [id number] Arrival date & time: 12/27/22  2022     History {Add pertinent medical, surgical, social history, OB history to HPI:1} Chief Complaint  Patient presents with  . Abscess    Delwin Hundt is a 32 y.o. male.  The history is provided by the patient and medical records. No language interpreter was used.  Abscess    32 year old male presenting complaining of skin infection.  Patient report 4 days ago he noticed a bump on the shaft of his penis that appears like an ingrown hair.  He tried to remove it and states that it begins to swell a bit.  He also endorses throbbing headache, having fever and chills for the past few days.  He does not endorse any runny nose sneezing coughing sore throat chest pain shortness of breath abdominal pain nausea vomiting diarrhea dysuria hematuria.  Denies any new sexual partner.  Remote history of trichomonas infection.  He reports today his headache along with fever and chills did resolved but he is here at the urging of his mom to get tested for COVID however patient denies any COVID symptoms.  Home Medications Prior to Admission medications   Medication Sig Start Date End Date Taking? Authorizing Provider  doxycycline (VIBRAMYCIN) 100 MG capsule Take 1 capsule (100 mg total) by mouth 2 (two) times daily. 02/17/21   Larene Pickett, PA-C  HYDROcodone-acetaminophen (NORCO/VICODIN) 5-325 MG per tablet Take 1-2 tablets by mouth every 4 (four) hours as needed for moderate pain or severe pain. 09/30/14   Alvina Chou, PA-C  penicillin v potassium (VEETID) 500 MG tablet Take 1 tablet (500 mg total) by mouth 4 (four) times daily. 09/30/14   Alvina Chou, PA-C  trimethoprim-polymyxin b (POLYTRIM) ophthalmic solution Place 1 drop into the right eye every 4 (four) hours. 08/23/16   Pisciotta, Elmyra Ricks, PA-C      Allergies    Patient has no known allergies.    Review of  Systems   Review of Systems  All other systems reviewed and are negative.   Physical Exam Updated Vital Signs BP (!) 148/105 (BP Location: Right Arm)   Pulse (!) 113   Temp (!) 101.2 F (38.4 C) (Oral)   Resp 18   Ht 6\' 4"  (1.93 m)   Wt 77 kg   SpO2 99%   BMI 20.66 kg/m  Physical Exam Vitals and nursing note reviewed.  Constitutional:      General: He is not in acute distress.    Appearance: He is well-developed.     Comments: Well-appearing male resting comfortably in the bed appears to be in no acute discomfort.  HENT:     Head: Normocephalic and atraumatic.     Mouth/Throat:     Mouth: Mucous membranes are moist.  Eyes:     Extraocular Movements: Extraocular movements intact.     Conjunctiva/sclera: Conjunctivae normal.     Pupils: Pupils are equal, round, and reactive to light.  Neck:     Comments: No nuchal rigidity Cardiovascular:     Rate and Rhythm: Tachycardia present.     Pulses: Normal pulses.     Heart sounds: Murmur heard.  Pulmonary:     Effort: Pulmonary effort is normal.     Breath sounds: Normal breath sounds. No wheezing, rhonchi or rales.  Abdominal:     Palpations: Abdomen is soft.     Tenderness: There is no abdominal tenderness.  Genitourinary:  Comments: Chaperone present during exam.  At the base of the penis there is some clusters of small ulcerations approximately 2 mm in size without any surrounding erythema or warmth.  No inguinal lymphadenopathy or inguinal hernia noted.  Testicle with normal lie, normal scrotum.  Circumcised penis. Musculoskeletal:     Cervical back: Normal range of motion and neck supple. No rigidity.  Skin:    Findings: No rash.  Neurological:     Mental Status: He is alert.    ED Results / Procedures / Treatments   Labs (all labs ordered are listed, but only abnormal results are displayed) Labs Reviewed - No data to display  EKG None  Radiology No results found.  Procedures Procedures  {Document  cardiac monitor, telemetry assessment procedure when appropriate:1}  Medications Ordered in ED Medications  acetaminophen (TYLENOL) tablet 650 mg (650 mg Oral Given 12/27/22 2051)    ED Course/ Medical Decision Making/ A&P   {   Click here for ABCD2, HEART and other calculatorsREFRESH Note before signing :1}                          Medical Decision Making Amount and/or Complexity of Data Reviewed Labs: ordered. Radiology: ordered.  Risk Prescription drug management.   BP (!) 148/105 (BP Location: Right Arm)   Pulse (!) 113   Temp (!) 101.2 F (38.4 C) (Oral)   Resp 18   Ht 6\' 4"  (1.93 m)   Wt 77 kg   SpO2 99%   BMI 20.66 kg/m   50:2 PM 32 year old male presenting complaining of skin infection.  Patient report 4 days ago he noticed a bump on the shaft of his penis that appears like an ingrown hair.  He tried to remove it and states that it begins to swell a bit.  He also endorses throbbing headache, having fever and chills for the past few days.  He does not endorse any runny nose sneezing coughing sore throat chest pain shortness of breath abdominal pain nausea vomiting diarrhea dysuria hematuria.  Denies any new sexual partner.  Remote history of trichomonas infection.  He reports today his headache along with fever and chills did resolved but he is here at the urging of his mom to get tested for COVID however patient denies any COVID symptoms.  On exam, this is a well-appearing male resting comfortably in the bed appears to be in no acute discomfort.  He is mildly tachycardic with a harsh systolic murmur heard.  {Document critical care time when appropriate:1} {Document review of labs and clinical decision tools ie heart score, Chads2Vasc2 etc:1}  {Document your independent review of radiology images, and any outside records:1} {Document your discussion with family members, caretakers, and with consultants:1} {Document social determinants of health affecting pt's  care:1} {Document your decision making why or why not admission, treatments were needed:1} Final Clinical Impression(s) / ED Diagnoses Final diagnoses:  None    Rx / DC Orders ED Discharge Orders     None

## 2022-12-28 LAB — GC/CHLAMYDIA PROBE AMP (~~LOC~~) NOT AT ARMC
Comment: NORMAL
Neisseria Gonorrhea: NEGATIVE

## 2022-12-28 LAB — URINALYSIS, ROUTINE W REFLEX MICROSCOPIC
Bacteria, UA: NONE SEEN
Bilirubin Urine: NEGATIVE
Glucose, UA: NEGATIVE mg/dL
Ketones, ur: 20 mg/dL — AB
Leukocytes,Ua: NEGATIVE
Nitrite: NEGATIVE
Protein, ur: 30 mg/dL — AB
Specific Gravity, Urine: 1.019 (ref 1.005–1.030)
pH: 6 (ref 5.0–8.0)

## 2022-12-28 LAB — SARS CORONAVIRUS 2 BY RT PCR: SARS Coronavirus 2 by RT PCR: NEGATIVE

## 2022-12-28 LAB — HIV ANTIBODY (ROUTINE TESTING W REFLEX): HIV Screen 4th Generation wRfx: NONREACTIVE

## 2022-12-28 MED ORDER — DOXYCYCLINE HYCLATE 100 MG PO CAPS: 100.0000 mg | ORAL_CAPSULE | Freq: Two times a day (BID) | ORAL | 0 refills | Status: DC

## 2022-12-28 MED ORDER — ACETAMINOPHEN 500 MG PO TABS: 500.0000 mg | ORAL_TABLET | Freq: Four times a day (QID) | ORAL | 0 refills | Status: AC | PRN

## 2022-12-28 NOTE — Discharge Instructions (Signed)
You have been evaluated for your symptoms.  Please take antibiotic as prescribed for treatment of your penile rash.  You may check the rest of your lab result through [facility], link below.  You may take Tylenol as needed for fever.  If your symptoms worsen please return to the ER for reassessment.

## 2023-10-26 ENCOUNTER — Emergency Department (HOSPITAL_BASED_OUTPATIENT_CLINIC_OR_DEPARTMENT_OTHER): Payer: Self-pay

## 2023-10-26 ENCOUNTER — Emergency Department (HOSPITAL_BASED_OUTPATIENT_CLINIC_OR_DEPARTMENT_OTHER)
Admission: EM | Admit: 2023-10-26 | Discharge: 2023-10-26 | Disposition: A | Discharge status: Home / Self Care | Payer: Self-pay | Attending: Emergency Medicine | Admitting: Emergency Medicine

## 2023-10-26 ENCOUNTER — Encounter (HOSPITAL_BASED_OUTPATIENT_CLINIC_OR_DEPARTMENT_OTHER): Payer: Self-pay | Admitting: Emergency Medicine

## 2023-10-26 ENCOUNTER — Other Ambulatory Visit: Payer: Self-pay

## 2023-10-26 DIAGNOSIS — N451 Epididymitis: Secondary | ICD-10-CM | POA: Insufficient documentation

## 2023-10-26 LAB — URINALYSIS, ROUTINE W REFLEX MICROSCOPIC
Bacteria, UA: NONE SEEN
Bilirubin Urine: NEGATIVE
Glucose, UA: NEGATIVE mg/dL
Hgb urine dipstick: NEGATIVE
Ketones, ur: NEGATIVE mg/dL
Nitrite: NEGATIVE
Specific Gravity, Urine: 1.034 — ABNORMAL HIGH (ref 1.005–1.030)
pH: 7 (ref 5.0–8.0)

## 2023-10-26 LAB — HIV ANTIBODY (ROUTINE TESTING W REFLEX): HIV Screen 4th Generation wRfx: NONREACTIVE

## 2023-10-26 MED ORDER — LIDOCAINE HCL (PF) 1 % IJ SOLN
INTRAMUSCULAR | Status: AC
  Administered 2023-10-26: 1 mL
  Filled 2023-10-26: qty 5

## 2023-10-26 MED ORDER — HYDROCODONE-ACETAMINOPHEN 5-325 MG PO TABS: 1.0000 | ORAL_TABLET | ORAL | 0 refills | Status: AC | PRN

## 2023-10-26 MED ORDER — DOXYCYCLINE HYCLATE 100 MG PO CAPS: 100.0000 mg | ORAL_CAPSULE | Freq: Two times a day (BID) | ORAL | 0 refills | Status: AC

## 2023-10-26 MED ORDER — KETOROLAC TROMETHAMINE 15 MG/ML IJ SOLN
15.0000 mg | Freq: Once | INTRAMUSCULAR | Status: AC
  Administered 2023-10-26: 15 mg via INTRAMUSCULAR
  Filled 2023-10-26: qty 1

## 2023-10-26 MED ORDER — CEFTRIAXONE SODIUM 500 MG IJ SOLR
500.0000 mg | INTRAMUSCULAR | Status: AC
  Administered 2023-10-26: 500 mg via INTRAMUSCULAR
  Filled 2023-10-26: qty 500

## 2023-10-26 NOTE — Discharge Instructions (Signed)
Follow-up with your [facility] in 1 week regarding your symptoms.  Take the doxycycline we prescribed you.  Take Tylenol and ibuprofen for your pain.  Take the Norco for any breakthrough pain but do not take it before driving or operating heavy machinery because it can make you drowsy.  Return to the emergency department if you develop worsening pain or any concerning symptoms

## 2023-10-26 NOTE — ED Triage Notes (Signed)
Right scrotum swollen since Tuesday this week. Afebrile. -N/-V. No urinary changes. No discharge.

## 2023-10-26 NOTE — ED Provider Notes (Signed)
[place] EMERGENCY DEPARTMENT AT [facility] Provider Note   CSN: [id number] Arrival date & time: 10/26/23  1614     History  No chief complaint on file.   Herbert Cohen is a 33 y.o. male.  33 year old male with a history of epididymitis who presents emergency department with right testicular swelling and pain.  Reports that 2 days ago he started having testicular swelling on the right side.  Feels like he got kicked in the groin but did not have any trauma.  Is currently sexually active and recently had unprotected intercourse.  No penile discharge.  No fevers.  No dysuria or frequency.       Home Medications Prior to Admission medications   Medication Sig Start Date End Date Taking? Authorizing Provider  acetaminophen (TYLENOL) 500 MG tablet Take 1 tablet (500 mg total) by mouth every 6 (six) hours as needed. 12/28/22   Fayrene Helper, PA-C  doxycycline (VIBRAMYCIN) 100 MG capsule Take 1 capsule (100 mg total) by mouth 2 (two) times daily for 10 days. 10/26/23 11/05/23  Rondel Baton, MD  HYDROcodone-acetaminophen (NORCO/VICODIN) 5-325 MG tablet Take 1 tablet by mouth every 4 (four) hours as needed for moderate pain (pain score 4-6) or severe pain (pain score 7-10). 10/26/23   Rondel Baton, MD  penicillin v potassium (VEETID) 500 MG tablet Take 1 tablet (500 mg total) by mouth 4 (four) times daily. 09/30/14   Emilia Beck, PA-C  trimethoprim-polymyxin b (POLYTRIM) ophthalmic solution Place 1 drop into the right eye every 4 (four) hours. 08/23/16   Pisciotta, Joni Reining, PA-C      Allergies    Patient has no known allergies.    Review of Systems   Review of Systems  Physical Exam Updated Vital Signs BP (!) 142/89   Pulse 86   Temp 98.8 F (37.1 C) (Oral)   Resp 16   SpO2 100%  Physical Exam Constitutional:      Appearance: Normal appearance.  Abdominal:     General: Abdomen is flat.  Genitourinary:    Comments: Chaperoned by NT Herbert Cohen.  Right testicle  swollen with epididymal tenderness to palpation and swelling.  Intact cremasteric reflex bilaterally.  Normal lie of the testicles bilaterally. Neurological:     Mental Status: He is alert.     ED Results / Procedures / Treatments   Labs (all labs ordered are listed, but only abnormal results are displayed) Labs Reviewed  URINALYSIS, ROUTINE W REFLEX MICROSCOPIC - Abnormal; Notable for the following components:      Result Value   Specific Gravity, Urine 1.034 (*)    Protein, ur TRACE (*)    Leukocytes,Ua TRACE (*)    All other components within normal limits  HIV ANTIBODY (ROUTINE TESTING W REFLEX)  RPR  GC/CHLAMYDIA PROBE AMP ([place]) NOT AT [facility]    EKG None  Radiology US SCROTUM W/DOPPLER Result Date: 10/26/2023 CLINICAL DATA:  Right testicular pain and swelling for several days, initial encounter EXAM: SCROTAL ULTRASOUND DOPPLER ULTRASOUND OF THE TESTICLES TECHNIQUE: Complete ultrasound examination of the testicles, epididymis, and other scrotal structures was performed. Color and spectral Doppler ultrasound were also utilized to evaluate blood flow to the testicles. COMPARISON:  02/17/2021 FINDINGS: Right testicle Measurements: 4.8 x 2.7 x 3.3 cm. No mass or microlithiasis visualized. Left testicle Measurements: 4.5 x 2.4 x 2.6 cm. No mass or microlithiasis visualized. Right epididymis: Epididymis is mildly enlarged and mildly hypervascular consistent with epididymitis on the right. Small 5 mm cyst is  noted. Left epididymis:  Normal in size and appearance. Hydrocele:  Bilateral small hydroceles are noted. Varicocele:  Mild left varicocele is noted. Pulsed Doppler interrogation of both testes demonstrates normal low resistance arterial and venous waveforms bilaterally. IMPRESSION: Prominent and hypervascular right epididymis consistent with epididymitis. Small hydroceles bilaterally. Mild left varicocele. Electronically Signed   By: Herbert Cohen M.D.   On: 10/26/2023 19:07     Procedures Procedures    Medications Ordered in ED Medications  cefTRIAXone (ROCEPHIN) injection 500 mg (500 mg Intramuscular Given 10/26/23 2023)  ketorolac (TORADOL) 15 MG/ML injection 15 mg (15 mg Intramuscular Given 10/26/23 2023)  lidocaine (PF) (XYLOCAINE) 1 % injection (1 mL  Given 10/26/23 2024)    ED Course/ Medical Decision Making/ A&P                                 Medical Decision Making Amount and/or Complexity of Data Reviewed Labs: ordered. Radiology: ordered.  Risk Prescription drug management.   Herbert Cohen is a 33 y.o. male with comorbidities that complicate the patient evaluation including epididymitis who presents emergency department with right testicular swelling and pain.   Initial Ddx:  Testicular torsion, epididymitis, orchitis, STI, trauma  MDM/Course:  Patient presents emergency department with testicular pain and swelling.  On exam does have epididymal tenderness to palpation but normal testicular lie.  He had an ultrasound that shows epididymitis without signs of testicular torsion.  He was given Toradol and IM ceftriaxone for his epididymitis.  Is sexually active and was sent home with doxycycline in case of STI cause.  HIV and RPR were nonreactive.  GC chlamydia pending.  Upon re-evaluation was stable.  Instructed to have his partners tested and prior to resuming intercourse.  This patient presents to the ED for concern of complaints listed in HPI, this involves an extensive number of treatment options, and is a complaint that carries with it a high risk of complications and morbidity. Disposition including potential need for admission considered.   Dispo: DC Home. Return precautions discussed including, but not limited to, those listed in the AVS. Allowed pt time to ask questions which were answered fully prior to dc.  Records reviewed Outpatient Clinic Notes The following labs were independently interpreted: Urinalysis and show no acute  abnormality I personally reviewed and interpreted cardiac monitoring: normal sinus rhythm  I have reviewed the patients home medications and made adjustments as needed  Portions of this note were generated with Dragon dictation software. Dictation errors may occur despite best attempts at proofreading.     Final Clinical Impression(s) / ED Diagnoses Final diagnoses:  Epididymitis, right    Rx / DC Orders ED Discharge Orders          Ordered    doxycycline (VIBRAMYCIN) 100 MG capsule  2 times daily        10/26/23 2008    HYDROcodone-acetaminophen (NORCO/VICODIN) 5-325 MG tablet  Every 4 hours PRN        10/26/23 2010              Rondel Baton, MD 10/27/23 1452

## 2023-10-27 LAB — GC/CHLAMYDIA PROBE AMP (~~LOC~~) NOT AT ARMC
Chlamydia: NEGATIVE
Comment: NEGATIVE
Comment: NORMAL
Neisseria Gonorrhea: NEGATIVE

## 2023-10-27 LAB — RPR: RPR Ser Ql: NONREACTIVE
